# Patient Record
Sex: Male | Born: 1965 | Race: Black or African American | Hispanic: No | Marital: Single | Smoking: Never smoker
Health system: Southern US, Community
[De-identification: ages and names within clinical notes are randomized; demographics above are authoritative.]

## PROBLEM LIST (undated history)

## (undated) DIAGNOSIS — M1990 Unspecified osteoarthritis, unspecified site: Secondary | ICD-10-CM

## (undated) DIAGNOSIS — J45909 Unspecified asthma, uncomplicated: Secondary | ICD-10-CM

## (undated) DIAGNOSIS — I10 Essential (primary) hypertension: Secondary | ICD-10-CM

## (undated) DIAGNOSIS — E119 Type 2 diabetes mellitus without complications: Secondary | ICD-10-CM

## (undated) DIAGNOSIS — Z9289 Personal history of other medical treatment: Secondary | ICD-10-CM

## (undated) DIAGNOSIS — G4730 Sleep apnea, unspecified: Secondary | ICD-10-CM

## (undated) HISTORY — PX: FEMUR SURGERY: SHX943

## (undated) HISTORY — PX: KNEE ARTHROSCOPY: SUR90

## (undated) HISTORY — PX: OTHER SURGICAL HISTORY: SHX169

## (undated) HISTORY — PX: FEMUR IM ROD REMOVAL: SHX1595

---

## 2017-05-21 ENCOUNTER — Other Ambulatory Visit: Payer: Self-pay | Admitting: Sports Medicine

## 2017-05-21 DIAGNOSIS — M545 Low back pain: Secondary | ICD-10-CM

## 2017-06-05 ENCOUNTER — Ambulatory Visit
Admission: RE | Admit: 2017-06-05 | Discharge: 2017-06-05 | Disposition: A | Discharge status: Home / Self Care | Payer: BLUE CROSS/BLUE SHIELD | Source: Ambulatory Visit | Attending: Sports Medicine | Admitting: Sports Medicine

## 2017-06-05 DIAGNOSIS — M545 Low back pain: Secondary | ICD-10-CM

## 2017-06-12 ENCOUNTER — Other Ambulatory Visit (HOSPITAL_COMMUNITY): Payer: Self-pay | Admitting: Sports Medicine

## 2017-06-12 DIAGNOSIS — M5126 Other intervertebral disc displacement, lumbar region: Secondary | ICD-10-CM

## 2017-07-02 ENCOUNTER — Encounter (HOSPITAL_COMMUNITY): Payer: Self-pay | Admitting: *Deleted

## 2017-07-02 NOTE — Progress Notes (Signed)
Isaac Williamson was recently diagnosed with HTN, Type II DM and Sleep Apnea.  Patient was not instructed to obtain a CBG machine. Insurance does not cover CPAP.  I requested office not sleep study, labs, EKG from PCP - Dr Maxwell Marion.

## 2017-07-04 ENCOUNTER — Ambulatory Visit (HOSPITAL_COMMUNITY)
Admission: RE | Admit: 2017-07-04 | Discharge: 2017-07-04 | Disposition: A | Discharge status: Home / Self Care | Payer: BLUE CROSS/BLUE SHIELD | Source: Ambulatory Visit | Attending: Sports Medicine | Admitting: Sports Medicine

## 2017-07-04 ENCOUNTER — Ambulatory Visit (HOSPITAL_COMMUNITY): Payer: BLUE CROSS/BLUE SHIELD | Admitting: Vascular Surgery

## 2017-07-04 ENCOUNTER — Encounter (HOSPITAL_COMMUNITY): Payer: Self-pay | Admitting: *Deleted

## 2017-07-04 ENCOUNTER — Encounter (HOSPITAL_COMMUNITY): Admission: RE | Disposition: A | Discharge status: Home / Self Care | Payer: Self-pay | Source: Ambulatory Visit

## 2017-07-04 DIAGNOSIS — M129 Arthropathy, unspecified: Secondary | ICD-10-CM | POA: Insufficient documentation

## 2017-07-04 DIAGNOSIS — I10 Essential (primary) hypertension: Secondary | ICD-10-CM | POA: Diagnosis not present

## 2017-07-04 DIAGNOSIS — M79605 Pain in left leg: Secondary | ICD-10-CM | POA: Insufficient documentation

## 2017-07-04 DIAGNOSIS — M5126 Other intervertebral disc displacement, lumbar region: Secondary | ICD-10-CM

## 2017-07-04 DIAGNOSIS — Z6841 Body Mass Index (BMI) 40.0 and over, adult: Secondary | ICD-10-CM | POA: Insufficient documentation

## 2017-07-04 DIAGNOSIS — Z7984 Long term (current) use of oral hypoglycemic drugs: Secondary | ICD-10-CM | POA: Insufficient documentation

## 2017-07-04 DIAGNOSIS — E119 Type 2 diabetes mellitus without complications: Secondary | ICD-10-CM | POA: Diagnosis not present

## 2017-07-04 DIAGNOSIS — M545 Low back pain: Secondary | ICD-10-CM | POA: Diagnosis present

## 2017-07-04 HISTORY — PX: RADIOLOGY WITH ANESTHESIA: SHX6223

## 2017-07-04 HISTORY — DX: Personal history of other medical treatment: Z92.89

## 2017-07-04 HISTORY — DX: Essential (primary) hypertension: I10

## 2017-07-04 HISTORY — DX: Sleep apnea, unspecified: G47.30

## 2017-07-04 HISTORY — DX: Type 2 diabetes mellitus without complications: E11.9

## 2017-07-04 HISTORY — DX: Unspecified osteoarthritis, unspecified site: M19.90

## 2017-07-04 HISTORY — DX: Rider (driver) (passenger) of other motorcycle injured in unspecified traffic accident, initial encounter: V29.99XA

## 2017-07-04 HISTORY — DX: Unspecified asthma, uncomplicated: J45.909

## 2017-07-04 LAB — CBC
HEMATOCRIT: 44.2 % (ref 39.0–52.0)
Hemoglobin: 14.8 g/dL (ref 13.0–17.0)
MCH: 31.2 pg (ref 26.0–34.0)
MCHC: 33.5 g/dL (ref 30.0–36.0)
MCV: 93.2 fL (ref 78.0–100.0)
Platelets: 170 10*3/uL (ref 150–400)
RBC: 4.74 MIL/uL (ref 4.22–5.81)
RDW: 13 % (ref 11.5–15.5)
WBC: 5.9 10*3/uL (ref 4.0–10.5)

## 2017-07-04 LAB — BASIC METABOLIC PANEL
ANION GAP: 8 (ref 5–15)
BUN: 8 mg/dL (ref 6–20)
CHLORIDE: 104 mmol/L (ref 101–111)
CO2: 25 mmol/L (ref 22–32)
Calcium: 8.8 mg/dL — ABNORMAL LOW (ref 8.9–10.3)
Creatinine, Ser: 1.11 mg/dL (ref 0.61–1.24)
GFR calc non Af Amer: >60 mL/min (ref >60)
Glucose, Bld: 94 mg/dL (ref 65–99)
POTASSIUM: 3.2 mmol/L — AB (ref 3.5–5.1)
SODIUM: 137 mmol/L (ref 135–145)

## 2017-07-04 LAB — GLUCOSE, CAPILLARY
GLUCOSE-CAPILLARY: 95 mg/dL (ref 65–99)
Glucose-Capillary: 81 mg/dL (ref 65–99)

## 2017-07-04 SURGERY — RADIOLOGY WITH ANESTHESIA
Anesthesia: Monitor Anesthesia Care

## 2017-07-04 MED ORDER — ALBUTEROL SULFATE (2.5 MG/3ML) 0.083% IN NEBU
INHALATION_SOLUTION | RESPIRATORY_TRACT | Status: AC
  Filled 2017-07-04: qty 3

## 2017-07-04 MED ORDER — LACTATED RINGERS IV SOLN
Freq: Once | INTRAVENOUS | Status: AC
  Administered 2017-07-04: 07:00:00 via INTRAVENOUS

## 2017-07-04 MED ORDER — ALBUTEROL SULFATE (2.5 MG/3ML) 0.083% IN NEBU
2.5000 mg | INHALATION_SOLUTION | Freq: Four times a day (QID) | RESPIRATORY_TRACT | Status: DC | PRN
  Administered 2017-07-04: 2.5 mg via RESPIRATORY_TRACT

## 2017-07-04 MED ORDER — ALBUTEROL SULFATE (2.5 MG/3ML) 0.083% IN NEBU
2.5000 mg | INHALATION_SOLUTION | Freq: Once | RESPIRATORY_TRACT | Status: AC
  Administered 2017-07-04: 2.5 mg via RESPIRATORY_TRACT

## 2017-07-04 MED ORDER — ALBUTEROL SULFATE (2.5 MG/3ML) 0.083% IN NEBU
INHALATION_SOLUTION | RESPIRATORY_TRACT | Status: AC
  Administered 2017-07-04: 2.5 mg via RESPIRATORY_TRACT
  Filled 2017-07-04: qty 3

## 2017-07-04 NOTE — Progress Notes (Signed)
Pt states he feels "much better" after nebulizer tx.    MRI questionnaire form faxed to Radiology

## 2017-07-04 NOTE — Anesthesia Preprocedure Evaluation (Signed)
Anesthesia Evaluation  Patient identified by MRN, date of birth, ID band Patient awake    Reviewed: Allergy & Precautions, Patient's Chart, lab work & pertinent test results  Airway Mallampati: II       Dental no notable dental hx. (+) Teeth Intact   Pulmonary    Pulmonary exam normal breath sounds clear to auscultation       Cardiovascular hypertension, Pt. on medications Normal cardiovascular exam Rhythm:Regular Rate:Normal     Neuro/Psych negative neurological ROS  negative psych ROS   GI/Hepatic negative GI ROS,   Endo/Other  diabetes, Type 2, Oral Hypoglycemic AgentsMorbid obesity  Renal/GU negative Renal ROS     Musculoskeletal   Abdominal (+) + obese,   Peds  Hematology   Anesthesia Other Findings   Reproductive/Obstetrics                             Anesthesia Physical Anesthesia Plan  ASA: III  Anesthesia Plan: MAC   Post-op Pain Management:    Induction:   PONV Risk Score and Plan: 1 and Ondansetron and Dexamethasone  Airway Management Planned: Natural Airway and Nasal Cannula  Additional Equipment:   Intra-op Plan:   Post-operative Plan:   Informed Consent: I have reviewed the patients History and Physical, chart, labs and discussed the procedure including the risks, benefits and alternatives for the proposed anesthesia with the patient or authorized representative who has indicated his/her understanding and acceptance.     Plan Discussed with: CRNA  Anesthesia Plan Comments:         Anesthesia Quick Evaluation

## 2017-07-04 NOTE — Anesthesia Postprocedure Evaluation (Signed)
Anesthesia Post Note  Patient: Isaac Williamson  Procedure(s) Performed: Procedure(s) (LRB): RADIOLOGY WITH ANESTHESIA MRI LUMBER SPINE WITHOUT CONTRAST (N/A)     Patient location during evaluation: PACU Anesthesia Type: MAC Level of consciousness: awake Pain management: pain level controlled Vital Signs Assessment: post-procedure vital signs reviewed and stable Respiratory status: spontaneous breathing Cardiovascular status: stable Postop Assessment: no signs of nausea or vomiting Anesthetic complications: no    Last Vitals:  Vitals:   07/04/17 1045 07/04/17 1100  BP: 107/68 92/75  Pulse: 84 88  Resp: 15 16  Temp: 36.6 C   SpO2: 94% 96%    Last Pain:  Vitals:   07/04/17 1045  TempSrc:   PainSc: 0-No pain   Pain Goal: Patients Stated Pain Goal: 3 (07/04/17 0723)               Ebin Palazzi JR,JOHN [name]

## 2017-07-04 NOTE — Transfer of Care (Signed)
Immediate Anesthesia Transfer of Care Note  Patient: Isaac Williamson  Procedure(s) Performed: Procedure(s): RADIOLOGY WITH ANESTHESIA MRI LUMBER SPINE WITHOUT CONTRAST (N/A)  Patient Location: PACU  Anesthesia Type:MAC  Level of Consciousness: awake, alert , oriented and patient cooperative  Airway & Oxygen Therapy: Patient Spontanous Breathing and Patient connected to nasal cannula oxygen  Post-op Assessment: Report given to RN, Post -op Vital signs reviewed and stable and Patient moving all extremities  Post vital signs: Reviewed and stable  Last Vitals:  Vitals:   07/04/17 0652  BP: 133/83  Pulse: 99  Resp: 18  Temp: (!) 36.3 C  SpO2: 99%    Last Pain:  Vitals:   07/04/17 0723  TempSrc:   PainSc: 8       Patients Stated Pain Goal: 3 (45/84/83 5075)  Complications: No apparent anesthesia complications

## 2017-07-04 NOTE — Progress Notes (Signed)
Pt c/o of shortness of breath this morning, has been going on for last 3 days, states if could be "cold related?"  Pt forgot to use albuterol inhaler this morning, requesting neb tx.  Spoke to Dr. Arby Barrette, made aware.  Orders for albuterol neb tx given, pt receiving now.

## 2017-07-05 ENCOUNTER — Encounter (HOSPITAL_COMMUNITY): Payer: Self-pay | Admitting: Radiology

## 2018-04-21 IMAGING — MR MR LUMBAR SPINE W/O CM
4 of 5 series · 21 of 48 positions shown · non-contrast
Comparison: CT lumbar spine 07/12/2014.

CLINICAL DATA: Low back pain radiating to LEFT leg. No known
injury.

EXAM:
MRI LUMBAR SPINE WITHOUT CONTRAST
TECHNIQUE: Multiplanar, multisequence MR imaging of the lumbar spine was
performed. No intravenous contrast was administered.

[Series 2: T2 · sagittal · 4.5mm · 0.62mm/px · 6 of 14 slices shown (1 of 2)]
[im 1/14]
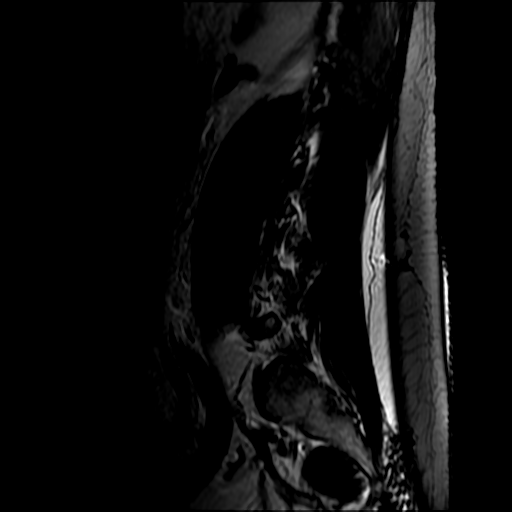
[im 3/14]
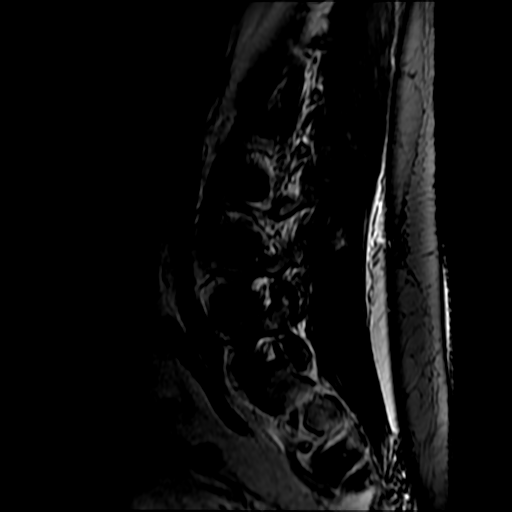
[im 6/14]
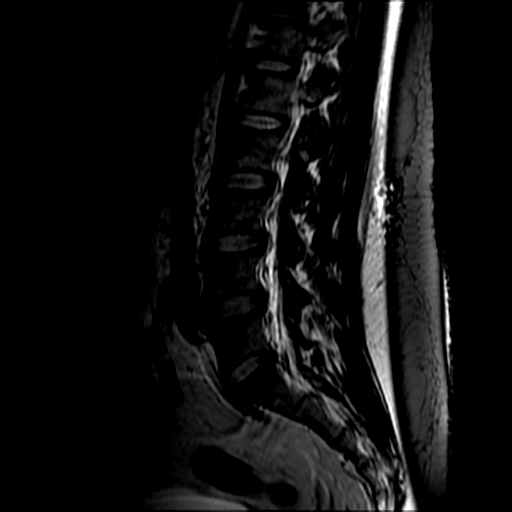
[im 8/14]
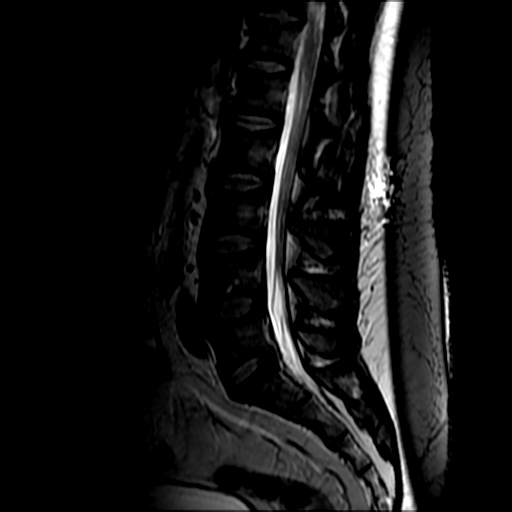
[im 11/14]
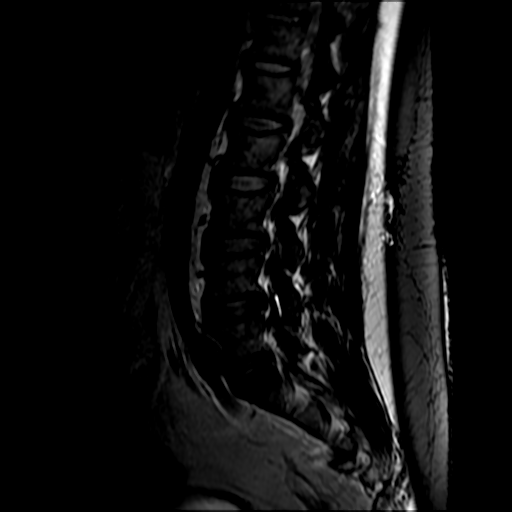
[im 14/14]
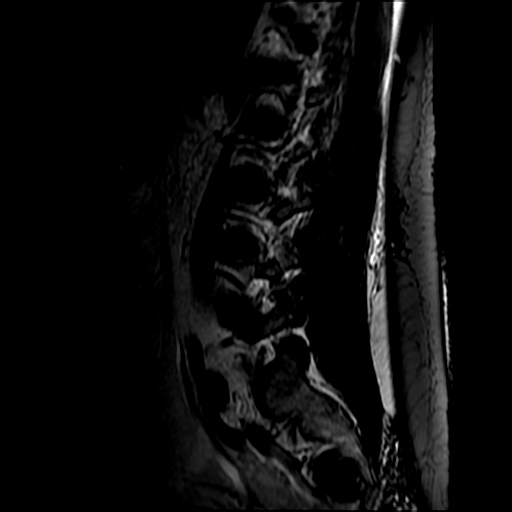

[Series 4: T1 · sagittal · 4.5mm · 0.62mm/px · 3 of 14 slices shown (1 of 2)]
[im 3/14]
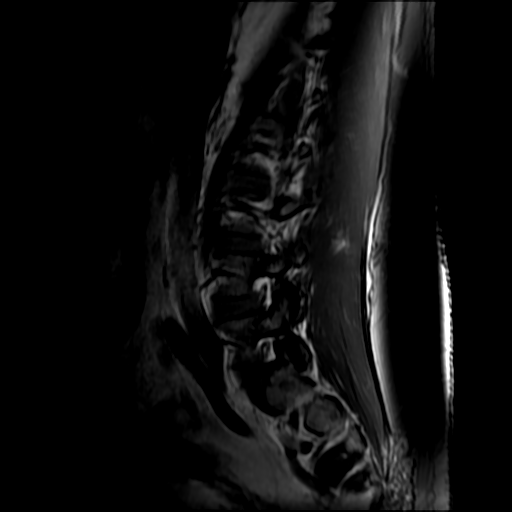
[im 8/14]
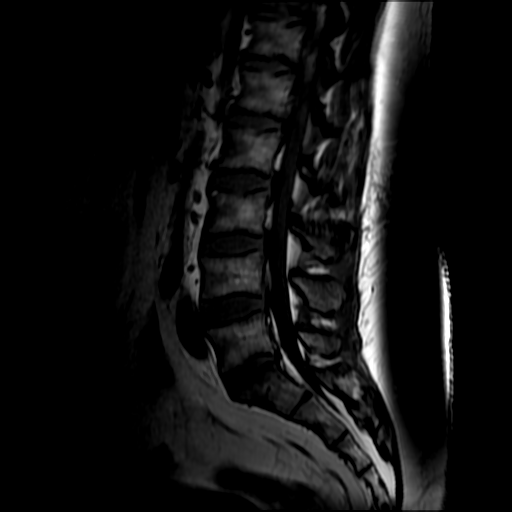
[im 14/14]
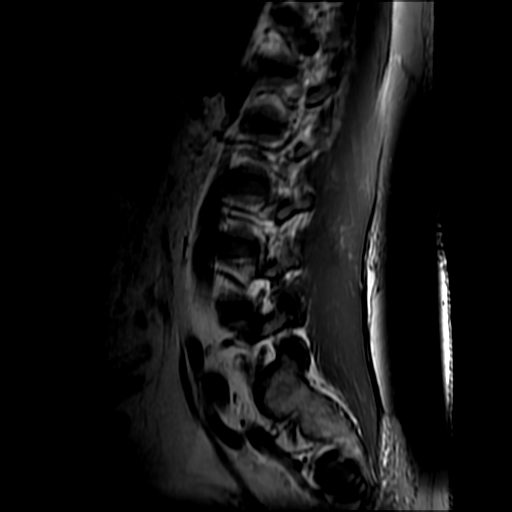

[Series 5: T2 · axial · 4.0mm · 0.43mm/px · z∈[-56,+139]mm · 9 of 36 slices shown (2 of 2)]
[im 1/36]
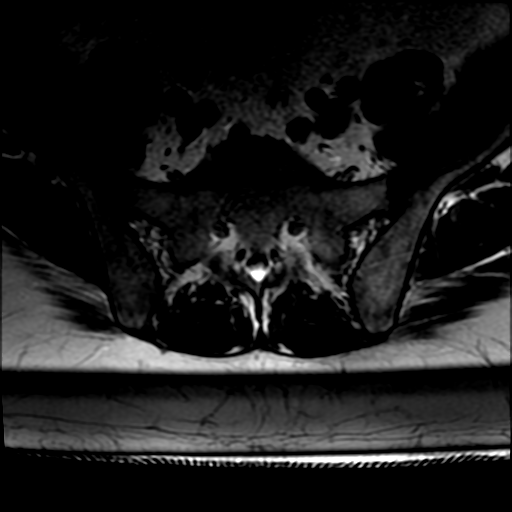
[im 6/36]
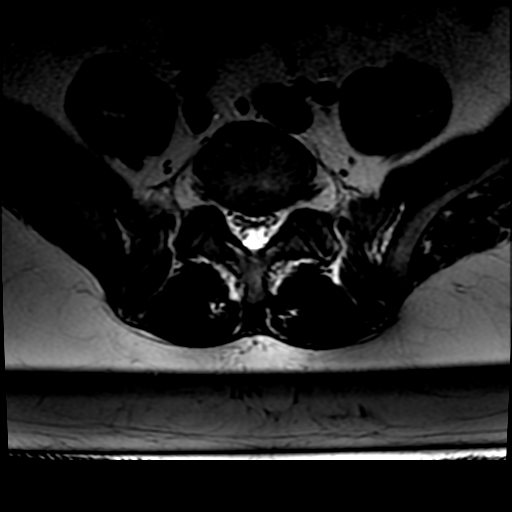
[im 11/36]
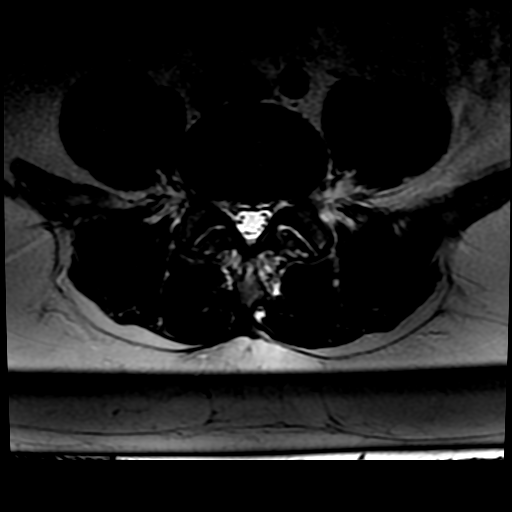
[im 16/36]
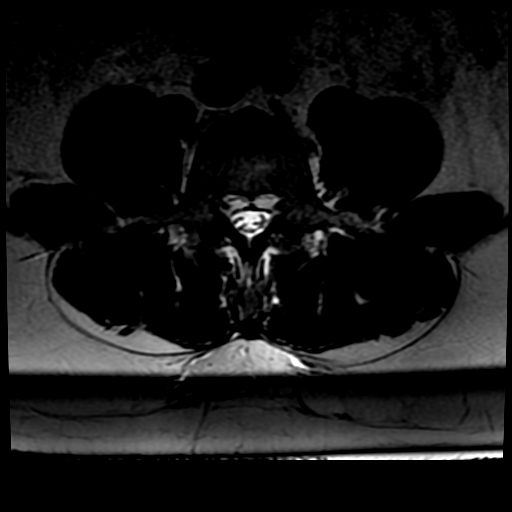
[im 18/36]
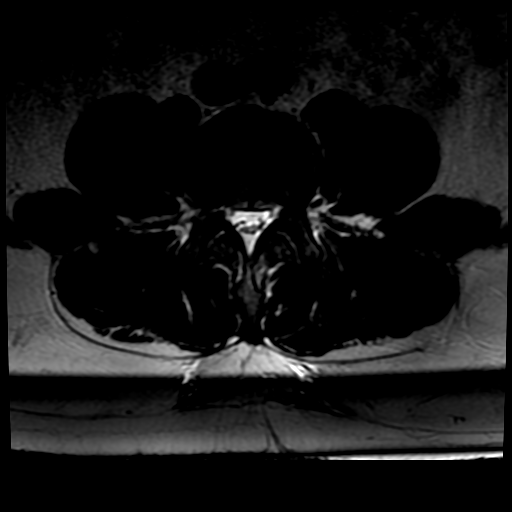
[im 21/36]
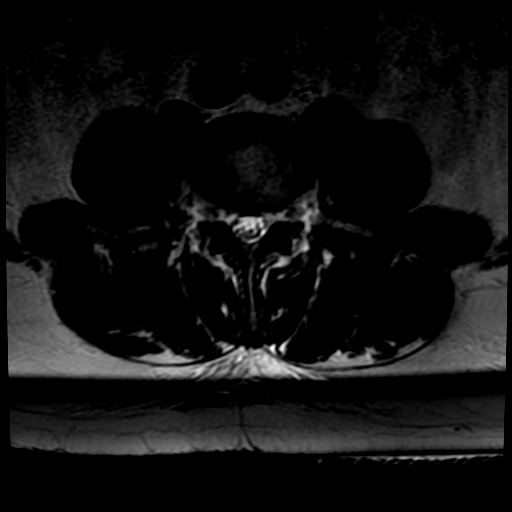
[im 26/36]
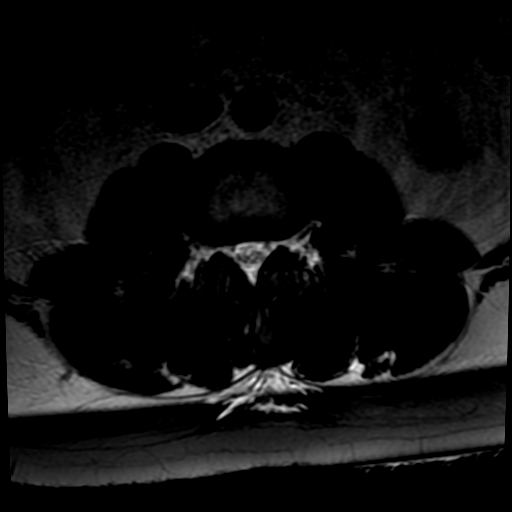
[im 31/36]
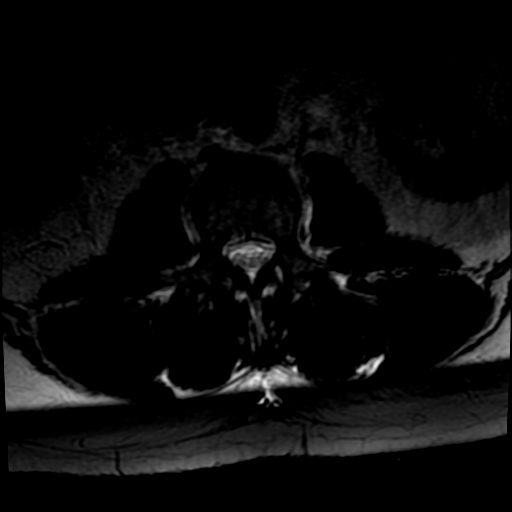
[im 36/36]
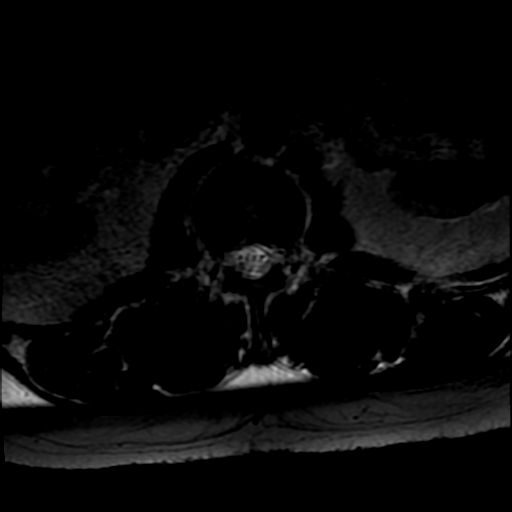

[Series 6: T1 · axial · 4.0mm · 0.86mm/px · z∈[-31,+114]mm · 3 of 36 slices shown (2 of 2)]
[im 6/36]
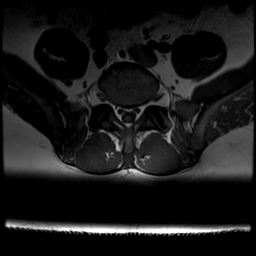
[im 18/36]
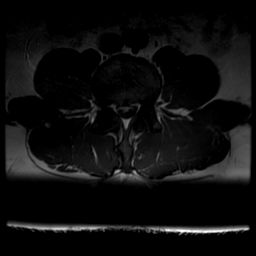
[im 31/36]
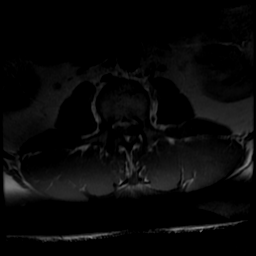

[21 of 48 positions shown; findings below may reference images not displayed]

FINDINGS: The patient underwent sedation/anesthesia for the examination. There
is mild motion degradation, primarily respiratory, but overall the
study is diagnostic.

Segmentation:  Standard.

Alignment:  Physiologic.

Vertebrae: Low signal intensity bone marrow throughout, likely
related to body habitus.

Conus medullaris: Extends to the L1 level and appears normal.

Paraspinal and other soft tissues: Increased body habitus.

Disc levels:

L1-L2:  Normal disc space.  Mild facet arthropathy.  No impingement.

L2-L3:  Normal disc space.  Mild facet arthropathy.  No impingement.

L3-L4: Normal disc space. Mild to moderate arthropathy. No
impingement.

L4-L5: Normal disc space. Moderate facet arthropathy. No
impingement.

L5-S1:  Annular bulge.  Mild facet arthropathy.  No impingement.
IMPRESSION: No disc protrusion or significant stenosis is observed on lumbar
spine MRI. No cause seen for the reported symptoms.

Lower lumbar facet arthropathy, without significant subarticular
zone or foraminal zone narrowing.
